# Patient Record
Sex: Male | Born: 1990 | Race: White | Hispanic: No | Marital: Single | Smoking: Never smoker
Health system: Southern US, Community
[De-identification: ages and names within clinical notes are randomized; demographics above are authoritative.]

## PROBLEM LIST (undated history)

## (undated) DIAGNOSIS — D4989 Neoplasm of unspecified behavior of other specified sites: Secondary | ICD-10-CM

## (undated) HISTORY — PX: WISDOM TOOTH EXTRACTION: SHX21

## (undated) HISTORY — PX: KNEE SURGERY: SHX244

---

## 2019-09-04 ENCOUNTER — Encounter (HOSPITAL_COMMUNITY): Payer: Self-pay | Admitting: *Deleted

## 2019-09-04 ENCOUNTER — Emergency Department (HOSPITAL_COMMUNITY): Payer: Self-pay

## 2019-09-04 ENCOUNTER — Other Ambulatory Visit: Payer: Self-pay

## 2019-09-04 ENCOUNTER — Emergency Department (HOSPITAL_COMMUNITY)
Admission: EM | Admit: 2019-09-04 | Discharge: 2019-09-04 | Disposition: A | Discharge status: Home / Self Care | Payer: Self-pay | Attending: Emergency Medicine | Admitting: Emergency Medicine

## 2019-09-04 DIAGNOSIS — K4090 Unilateral inguinal hernia, without obstruction or gangrene, not specified as recurrent: Secondary | ICD-10-CM | POA: Insufficient documentation

## 2019-09-04 LAB — URINALYSIS, ROUTINE W REFLEX MICROSCOPIC
Bilirubin Urine: NEGATIVE
Glucose, UA: NEGATIVE mg/dL
Hgb urine dipstick: NEGATIVE
Ketones, ur: NEGATIVE mg/dL
Leukocytes,Ua: NEGATIVE
Nitrite: NEGATIVE
Protein, ur: NEGATIVE mg/dL
Specific Gravity, Urine: 1.015 (ref 1.005–1.030)
pH: 6 (ref 5.0–8.0)

## 2019-09-04 LAB — CBC
HCT: 45.4 % (ref 39.0–52.0)
Hemoglobin: 15.4 g/dL (ref 13.0–17.0)
MCH: 29.5 pg (ref 26.0–34.0)
MCHC: 33.9 g/dL (ref 30.0–36.0)
MCV: 87 fL (ref 80.0–100.0)
Platelets: 281 10*3/uL (ref 150–400)
RBC: 5.22 MIL/uL (ref 4.22–5.81)
RDW: 11.9 % (ref 11.5–15.5)
WBC: 5.8 10*3/uL (ref 4.0–10.5)
nRBC: 0 % (ref 0.0–0.2)

## 2019-09-04 LAB — COMPREHENSIVE METABOLIC PANEL
ALT: 46 U/L — ABNORMAL HIGH (ref 0–44)
AST: 33 U/L (ref 15–41)
Albumin: 4.7 g/dL (ref 3.5–5.0)
Alkaline Phosphatase: 55 U/L (ref 38–126)
Anion gap: 10 (ref 5–15)
BUN: 12 mg/dL (ref 6–20)
CO2: 28 mmol/L (ref 22–32)
Calcium: 9.5 mg/dL (ref 8.9–10.3)
Chloride: 104 mmol/L (ref 98–111)
Creatinine, Ser: 1.24 mg/dL (ref 0.61–1.24)
GFR calc Af Amer: >60 mL/min (ref >60)
GFR calc non Af Amer: >60 mL/min (ref >60)
Glucose, Bld: 109 mg/dL — ABNORMAL HIGH (ref 70–99)
Potassium: 3.8 mmol/L (ref 3.5–5.1)
Sodium: 142 mmol/L (ref 135–145)
Total Bilirubin: 0.5 mg/dL (ref 0.3–1.2)
Total Protein: 7.2 g/dL (ref 6.5–8.1)

## 2019-09-04 LAB — LIPASE, BLOOD: Lipase: 37 U/L (ref 11–51)

## 2019-09-04 MED ORDER — SODIUM CHLORIDE 0.9% FLUSH: 3.0000 mL | Freq: Once | INTRAVENOUS | Status: DC

## 2019-09-04 NOTE — ED Notes (Signed)
Pt transported to Ultrasound.  

## 2019-09-04 NOTE — ED Triage Notes (Signed)
The [facility]  No blood was drawn  He was having testicle pain

## 2019-09-04 NOTE — ED Provider Notes (Addendum)
[facility] EMERGENCY DEPARTMENT Provider Note   CSN: [id number] Arrival date & time: 09/04/19  1508     History Chief Complaint  Patient presents with  . Abdominal Pain    Paul Ortega is a 29 y.o. male who presents to ED with a chief complaint of right testicle, groin pain, left-sided abdominal pain and back pain.  1 week ago started having right testicle pain that would radiate up to his groin.  Symptoms have been constant without specific aggravating or alleviating factor.  He was seen and evaluated at urgent care 2 days after symptoms began with negative UA.  He has not been taking any medications at home to help with his pain.  States that he googled his symptoms and was concerned about testicular torsion.  Has been performing self exams at home but is not able to palpate a mass or specific area of tenderness.  He is concerned that he may have a hernia as he does heavy lifting.  Reports the left-sided abdominal pain and back pain has gradually gotten worse.  Denies any nausea, vomiting, diarrhea, cough, shortness of breath, dysuria, penile discharge, concern for STDs or history of testicular torsion. He does do heavy lifting at work.  HPI     History reviewed. No pertinent past medical history.  There are no problems to display for this patient.   History reviewed. No pertinent surgical history.     No family history on file.  Social History   Tobacco Use  . Smoking status: Never Smoker  . Smokeless tobacco: Never Used  Substance Use Topics  . Alcohol use: Yes  . Drug use: Not on file    Home Medications Prior to Admission medications   Not on File    Allergies    Patient has no known allergies.  Review of Systems   Review of Systems  Constitutional: Negative for appetite change, chills and fever.  HENT: Negative for ear pain, rhinorrhea, sneezing and sore throat.   Eyes: Negative for photophobia and visual disturbance.  Respiratory:  Negative for cough, chest tightness, shortness of breath and wheezing.   Cardiovascular: Negative for chest pain and palpitations.  Gastrointestinal: Positive for abdominal pain. Negative for blood in stool, constipation, diarrhea, nausea and vomiting.  Genitourinary: Positive for testicular pain. Negative for dysuria, hematuria and urgency.  Musculoskeletal: Positive for back pain. Negative for myalgias.  Skin: Negative for rash.  Neurological: Negative for dizziness, weakness and light-headedness.    Physical Exam Updated Vital Signs BP (!) 151/87 (BP Location: Left Arm)   Pulse 75   Temp 98.3 F (36.8 C) (Oral)   Resp 17   Ht 5\' 10"  (1.778 m)   Wt 72.6 kg   SpO2 100%   BMI 22.96 kg/m   Physical Exam Vitals and nursing note reviewed. Exam conducted with a chaperone present.  Constitutional:      General: He is not in acute distress.    Appearance: He is well-developed.  HENT:     Head: Normocephalic and atraumatic.     Nose: Nose normal.  Eyes:     General: No scleral icterus.       Left eye: No discharge.     Conjunctiva/sclera: Conjunctivae normal.  Cardiovascular:     Rate and Rhythm: Normal rate and regular rhythm.     Heart sounds: Normal heart sounds. No murmur. No friction rub. No gallop.   Pulmonary:     Effort: Pulmonary effort is normal. No respiratory distress.  Breath sounds: Normal breath sounds.  Abdominal:     General: Bowel sounds are normal. There is no distension.     Palpations: Abdomen is soft.     Tenderness: There is no abdominal tenderness. There is no guarding.  Genitourinary:    Testes:        Right: Tenderness or swelling not present.        Left: Tenderness or swelling not present.     Comments: Normal male genitalia noted. Penis, scrotum, and testicles without swelling, lesions, rashes, or tenderness present. No penile discharge noted.  Cremasteric reflex intact. RN served as Producer, television/film/video during the exam.  I believe I was able to palpate a  hernia in the R inguinal area which does not appear painful when I palpate. Musculoskeletal:        General: Normal range of motion.     Cervical back: Normal range of motion and neck supple.  Skin:    General: Skin is warm and dry.     Findings: No rash.  Neurological:     Mental Status: He is alert.     Motor: No abnormal muscle tone.     Coordination: Coordination normal.     ED Results / Procedures / Treatments   Labs (all labs ordered are listed, but only abnormal results are displayed) Labs Reviewed  COMPREHENSIVE METABOLIC PANEL - Abnormal; Notable for the following components:      Result Value   Glucose, Bld 109 (*)    ALT 46 (*)    All other components within normal limits  URINE CULTURE  LIPASE, BLOOD  CBC  URINALYSIS, ROUTINE W REFLEX MICROSCOPIC    EKG None  Radiology US SCROTUM W/DOPPLER  Result Date: 09/04/2019 CLINICAL DATA:  RIGHT testicle and groin pain for 1 week. EXAM: SCROTAL ULTRASOUND DOPPLER ULTRASOUND OF THE TESTICLES TECHNIQUE: Complete ultrasound examination of the testicles, epididymis, and other scrotal structures was performed. Color and spectral Doppler ultrasound were also utilized to evaluate blood flow to the testicles. COMPARISON:  None. FINDINGS: Right testicle Measurements: 4.6 x 2.4 x 2.3 cm. No mass or microlithiasis visualized. Left testicle Measurements: 4.2 x 2.1 x 3 cm. No mass or microlithiasis visualized. Right epididymis:  Normal in size and appearance. Left epididymis:  Normal in size and appearance. Hydrocele:  None visualized. Varicocele:  Minimal LEFT-sided varicocele. Pulsed Doppler interrogation of both testes demonstrates normal low resistance arterial and venous waveforms bilaterally. IMPRESSION: 1. No acute findings. No evidence of testicular torsion or orchitis. 2. Minimal LEFT-sided varicocele. Electronically Signed   By: Franki Cabot M.D.   On: 09/04/2019 17:53    Procedures Procedures (including critical care  time)  Medications Ordered in ED Medications  sodium chloride flush (NS) 0.9 % injection 3 mL (3 mLs Intravenous Not Given 09/04/19 1605)    ED Course  I have reviewed the triage vital signs and the nursing notes.  Pertinent labs & imaging results that were available during my care of the patient were reviewed by me and considered in my medical decision making (see chart for details).    MDM Rules/Calculators/A&P                      29 year old male presenting to the ED for right-sided testicle and groin pain as well as left-sided abdominal pain for the past week.  Testicle pain and groin pain has been constant.  Negative urinalysis at urgent care when symptoms began.  There are no testicular masses or  tenderness palpation on exam.  Tenderness with patient left-sided abdomen round about rebound or guarding.  Lower back is generally tender without midline tenderness.  Lab work is unremarkable.  Will obtain ultrasound to rule out testicular torsion and reassess.  He declines pain medication at this time. He is overall well appearing.  Ultrasound is negative for torsion, orchitis or other abnormalities.  There is a left-sided varicocele.  Overall believe the cause of his symptoms could be this inguinal hernia.  He is declining further work-up for abdominal discomfort and states that he is able to manage his pain at home if needed.  He is able to tolerate p.o. intake without difficulty.  He would like follow-up with the specialist regarding his inguinal hernia so we will provide this for him.  We will have him continue over-the-counter pain medication and return for worsening symptoms.  Patient is hemodynamically stable, in NAD, and able to ambulate in the ED. Evaluation does not show pathology that would require ongoing emergent intervention or inpatient treatment. I explained the diagnosis to the patient. Pain has been managed and has no complaints prior to discharge. Patient is comfortable with  above plan and is stable for discharge at this time. All questions were answered prior to disposition. Strict return precautions for returning to the ED were discussed. Encouraged follow up with PCP.   An After Visit Summary was printed and given to the patient.   Portions of this note were generated with [employer]. Dictation errors may occur despite best attempts at proofreading.  Final Clinical Impression(s) / ED Diagnoses Final diagnoses:  Unilateral inguinal hernia without obstruction or gangrene, recurrence not specified    Rx / DC Orders ED Discharge Orders    None        Delia Heady, PA-C 09/04/19 1802    Lajean Saver, MD 09/04/19 2241

## 2019-09-04 NOTE — ED Triage Notes (Signed)
Thew pt has had abd pain for one week  No n v or diarrhea sl constipation.  He was seen at [facility] several days ago  But hes no better. No temp

## 2019-09-04 NOTE — Discharge Instructions (Signed)
Take Tylenol or ibuprofen as needed for your discomfort. Follow-up with the surgery center listed below. Return to the ED if you start to experience worsening symptoms, testicle pain or swelling, discharge, fever.

## 2019-09-06 LAB — URINE CULTURE: Culture: NO GROWTH

## 2020-07-16 ENCOUNTER — Ambulatory Visit (HOSPITAL_COMMUNITY)
Admission: EM | Admit: 2020-07-16 | Discharge: 2020-07-16 | Disposition: A | Discharge status: Home / Self Care | Payer: BLUE CROSS/BLUE SHIELD | Attending: Emergency Medicine | Admitting: Emergency Medicine

## 2020-07-16 ENCOUNTER — Other Ambulatory Visit: Payer: Self-pay

## 2020-07-16 ENCOUNTER — Ambulatory Visit (INDEPENDENT_AMBULATORY_CARE_PROVIDER_SITE_OTHER): Payer: BLUE CROSS/BLUE SHIELD

## 2020-07-16 ENCOUNTER — Encounter (HOSPITAL_COMMUNITY): Payer: Self-pay | Admitting: *Deleted

## 2020-07-16 DIAGNOSIS — K5900 Constipation, unspecified: Secondary | ICD-10-CM

## 2020-07-16 DIAGNOSIS — R194 Change in bowel habit: Secondary | ICD-10-CM | POA: Insufficient documentation

## 2020-07-16 DIAGNOSIS — R599 Enlarged lymph nodes, unspecified: Secondary | ICD-10-CM | POA: Diagnosis present

## 2020-07-16 HISTORY — DX: Neoplasm of unspecified behavior of other specified sites: D49.89

## 2020-07-16 LAB — CBC WITH DIFFERENTIAL/PLATELET
Abs Immature Granulocytes: 0.01 10*3/uL (ref 0.00–0.07)
Basophils Absolute: 0.1 10*3/uL (ref 0.0–0.1)
Basophils Relative: 2 %
Eosinophils Absolute: 0.1 10*3/uL (ref 0.0–0.5)
Eosinophils Relative: 3 %
HCT: 41.4 % (ref 39.0–52.0)
Hemoglobin: 14.5 g/dL (ref 13.0–17.0)
Immature Granulocytes: 0 %
Lymphocytes Relative: 48 %
Lymphs Abs: 2.7 10*3/uL (ref 0.7–4.0)
MCH: 30 pg (ref 26.0–34.0)
MCHC: 35 g/dL (ref 30.0–36.0)
MCV: 85.7 fL (ref 80.0–100.0)
Monocytes Absolute: 0.6 10*3/uL (ref 0.1–1.0)
Monocytes Relative: 12 %
Neutro Abs: 1.9 10*3/uL (ref 1.7–7.7)
Neutrophils Relative %: 35 %
Platelets: 234 10*3/uL (ref 150–400)
RBC: 4.83 MIL/uL (ref 4.22–5.81)
RDW: 12.3 % (ref 11.5–15.5)
WBC: 5.4 10*3/uL (ref 4.0–10.5)
nRBC: 0 % (ref 0.0–0.2)

## 2020-07-16 NOTE — Discharge Instructions (Addendum)
Your abdominal film today does not demonstrate any concerning findings or significant stool burden.  Increase the water in your diet.  Incorporate plenty of fiber in your diet such as raw fruits and vegetables.  Regular exercise is also helpful in managing your bowel habits.  A supplement such as metamucil may also be helpful.  If needed for constipation may use miralax.  I do recommend establishing with a primary care provider as you may need further evaluation or GI referral if any persistent or worsening of symptoms.  I would call you if you have any abnormal testing today, you may see your results on your MyChart as well.

## 2020-07-16 NOTE — ED Provider Notes (Signed)
[facility]    CSN: [id number] Arrival date & time: 07/16/20  1130      History   Chief Complaint Chief Complaint  Patient presents with  . Abdominal Pain  . Constipation    HPI Paul Ortega is a 29 y.o. male.   Paul Ortega presents with complaints of 1 month of feeling constipated. States he previously would have a bm approximately daily, but for the past month feels like he can only pass stool approximately every 2- 3 days and it feels difficult. He took OTC dulcolax which caused large BM and even diarrhea, last week. No blood or mucus to stool. No nausea or vomiting. No specific loss of appetite. Denies any changes to diet, exercise or stress level. Denies any new medications or recent surgeries. Also with a "bump" under chin which he noted approximately 1 month ago. Hasn't increased in size and isn't tender but hasn't resolved so he was worried about this. Denies any dental pain or other URI issues    ROS per HPI, negative if not otherwise mentioned.      Past Medical History:  Diagnosis Date  . Knee tumor    benign, as teenager    There are no problems to display for this patient.   Past Surgical History:  Procedure Laterality Date  . KNEE SURGERY     to remove benign tumor  . WISDOM TOOTH EXTRACTION         Home Medications    Prior to Admission medications   Not on File    Family History Family History  Problem Relation Age of Onset  . Healthy Mother   . Cancer Father     Social History Social History   Tobacco Use  . Smoking status: Never Smoker  . Smokeless tobacco: Never Used  Vaping Use  . Vaping Use: Never used  Substance Use Topics  . Alcohol use: Yes    Comment: occasionally  . Drug use: Not Currently    Types: Marijuana     Allergies   Patient has no known allergies.   Review of Systems Review of Systems   Physical Exam Triage Vital Signs ED Triage Vitals  Enc Vitals Group     BP 07/16/20  1246 128/66     Pulse Rate 07/16/20 1246 (!) 57     Resp 07/16/20 1246 12     Temp 07/16/20 1246 97.7 F (36.5 C)     Temp Source 07/16/20 1246 Oral     SpO2 07/16/20 1246 100 %     Weight --      Height --      Head Circumference --      Peak Flow --      Pain Score 07/16/20 1247 3     Pain Loc --      Pain Edu? --      Excl. in [place]? --    No data found.  Updated Vital Signs BP 128/66   Pulse (!) 57   Temp 97.7 F (36.5 C) (Oral)   Resp 12   SpO2 100%   Visual Acuity Right Eye Distance:   Left Eye Distance:   Bilateral Distance:    Right Eye Near:   Left Eye Near:    Bilateral Near:     Physical Exam Constitutional:      Appearance: He is well-developed.  HENT:     Nose:      Comments: Approximately 72mm mobile palpable lymph node, at midline;  non tender Cardiovascular:     Rate and Rhythm: Normal rate.  Pulmonary:     Effort: Pulmonary effort is normal.  Abdominal:     Tenderness: There is no abdominal tenderness.  Skin:    General: Skin is warm and dry.  Neurological:     Mental Status: He is alert and oriented to person, place, and time.      UC Treatments / Results  Labs (all labs ordered are listed, but only abnormal results are displayed) Labs Reviewed  CBC WITH DIFFERENTIAL/PLATELET    EKG   Radiology DG Abd 2 Views  Result Date: 07/16/2020 CLINICAL DATA:  Constipation for 1 month EXAM: ABDOMEN - 2 VIEW COMPARISON:  None. FINDINGS: Scattered large and small bowel gas is noted. No free air is noted. No significant retained fecal material is seen to correspond with the given clinical history. No soft tissue mass is seen. No bony abnormality is noted. IMPRESSION: No acute abnormality noted. Electronically Signed   By: Inez Catalina M.D.   On: 07/16/2020 13:28    Procedures Procedures (including critical care time)  Medications Ordered in UC Medications - No data to display  Initial Impression / Assessment and Plan / UC Course  I have  reviewed the triage vital signs and the nursing notes.  Pertinent labs & imaging results that were available during my care of the patient were reviewed by me and considered in my medical decision making (see chart for details).     Abdominal film without acute findings and without significant stool burden. Still passing stool but decreased frequency. Bowel habits discussed, encouraged use of supplement such as metamucil, increased water and activity. Cbc obtained due to patient concern with submental lymph node. Follow up recommendations provided. Patient verbalized understanding and agreeable to plan.   Final Clinical Impressions(s) / UC Diagnoses   Final diagnoses:  Change in bowel habits  Palpable lymph node     Discharge Instructions     Your abdominal film today does not demonstrate any concerning findings or significant stool burden.  Increase the water in your diet.  Incorporate plenty of fiber in your diet such as raw fruits and vegetables.  Regular exercise is also helpful in managing your bowel habits.  A supplement such as metamucil may also be helpful.  If needed for constipation may use miralax.  I do recommend establishing with a primary care provider as you may need further evaluation or GI referral if any persistent or worsening of symptoms.  I would call you if you have any abnormal testing today, you may see your results on your MyChart as well.     ED Prescriptions    None     PDMP not reviewed this encounter.   Zigmund Gottron, NP 07/16/20 1510

## 2020-07-16 NOTE — ED Triage Notes (Signed)
C/O constipation x 1 month without any significant BMs.  C/O constant bloated feeling with intermittent abd pains.  Denies n/v.  Has used an OTC laxative, which has become painful due to causing significant abd cramping, and doesn't provide relief of constipation, so pt discontinued use.  Also concerned about "lump" under chin.

## 2020-08-16 ENCOUNTER — Ambulatory Visit (HOSPITAL_COMMUNITY)
Admission: EM | Admit: 2020-08-16 | Discharge: 2020-08-16 | Disposition: A | Discharge status: Home / Self Care | Payer: BLUE CROSS/BLUE SHIELD | Attending: Emergency Medicine | Admitting: Emergency Medicine

## 2020-08-16 ENCOUNTER — Encounter (HOSPITAL_COMMUNITY): Payer: Self-pay | Admitting: Emergency Medicine

## 2020-08-16 DIAGNOSIS — U071 COVID-19: Secondary | ICD-10-CM | POA: Diagnosis not present

## 2020-08-16 DIAGNOSIS — R112 Nausea with vomiting, unspecified: Secondary | ICD-10-CM | POA: Insufficient documentation

## 2020-08-16 DIAGNOSIS — J029 Acute pharyngitis, unspecified: Secondary | ICD-10-CM | POA: Insufficient documentation

## 2020-08-16 DIAGNOSIS — R197 Diarrhea, unspecified: Secondary | ICD-10-CM | POA: Insufficient documentation

## 2020-08-16 DIAGNOSIS — J111 Influenza due to unidentified influenza virus with other respiratory manifestations: Secondary | ICD-10-CM

## 2020-08-16 LAB — RESP PANEL BY RT-PCR (FLU A&B, COVID) ARPGX2
Influenza A by PCR: NEGATIVE
Influenza B by PCR: NEGATIVE
SARS Coronavirus 2 by RT PCR: POSITIVE — AB

## 2020-08-16 MED ORDER — ONDANSETRON 4 MG PO TBDP: 4.0000 mg | ORAL_TABLET | Freq: Three times a day (TID) | ORAL | 0 refills | Status: AC | PRN

## 2020-08-16 NOTE — ED Triage Notes (Signed)
Head congestion, ears hurt, sore throat, diarrhea for 2 days.  Onset 08/14/2020.  Patient having general body aches

## 2020-08-16 NOTE — ED Provider Notes (Signed)
[facility]    CSN: [id number] Arrival date & time: 08/16/20  6333      History   Chief Complaint Chief Complaint  Patient presents with  . Sore Throat    HPI Paul Ortega is a 30 y.o. male.   Paul Ortega presents with complaints of symptoms which started two days ago to include- fevers, chills, sweating, body aches, nausea and vomiting, congestion, sore throat, cough, headache, and diarrhea. No shortness of breath. No skin rash.  A friend tested positive yesterday, he was around them a few days prior to symptoms onset. Dayquil and nyquil have not helped with symptoms. Last took at 0200 this morning. No current nausea. Vaccinated for covid-19, but not boosted.   ROS per HPI, negative if not otherwise mentioned.       Past Medical History:  Diagnosis Date  . Knee tumor    benign, as teenager    There are no problems to display for this patient.   Past Surgical History:  Procedure Laterality Date  . KNEE SURGERY     to remove benign tumor  . WISDOM TOOTH EXTRACTION         Home Medications    Prior to Admission medications   Medication Sig Start Date End Date Taking? Authorizing Provider  ondansetron (ZOFRAN-ODT) 4 MG disintegrating tablet Take 1 tablet (4 mg total) by mouth every 8 (eight) hours as needed for nausea or vomiting. 08/16/20  Yes Torrin Frein, Dorene Grebe B, NP  Pseudoeph-Doxylamine-DM-APAP (NYQUIL PO) Take by mouth.   Yes [provider]    Family History Family History  Problem Relation Age of Onset  . Healthy Mother   . Cancer Father     Social History Social History   Tobacco Use  . Smoking status: Never Smoker  . Smokeless tobacco: Never Used  Vaping Use  . Vaping Use: Never used  Substance Use Topics  . Alcohol use: Yes    Comment: occasionally  . Drug use: Not Currently    Types: Marijuana     Allergies   Patient has no known allergies.   Review of Systems Review of Systems   Physical  Exam Triage Vital Signs ED Triage Vitals  Enc Vitals Group     BP 08/16/20 0837 108/72     Pulse Rate 08/16/20 0837 89     Resp 08/16/20 0837 20     Temp 08/16/20 0837 99.2 F (37.3 C)     Temp Source 08/16/20 0837 Oral     SpO2 08/16/20 0837 98 %     Weight --      Height --      Head Circumference --      Peak Flow --      Pain Score 08/16/20 0834 7     Pain Loc --      Pain Edu? --      Excl. in [place]? --    No data found.  Updated Vital Signs BP 108/72 (BP Location: Left Arm)   Pulse 89   Temp 99.2 F (37.3 C) (Oral)   Resp 20   SpO2 98%   Visual Acuity Right Eye Distance:   Left Eye Distance:   Bilateral Distance:    Right Eye Near:   Left Eye Near:    Bilateral Near:     Physical Exam Constitutional:      Appearance: He is well-developed. He is ill-appearing.  Cardiovascular:     Rate and Rhythm: Normal rate.  Pulmonary:  Effort: Pulmonary effort is normal.  Skin:    General: Skin is warm and dry.  Neurological:     Mental Status: He is alert and oriented to person, place, and time.      UC Treatments / Results  Labs (all labs ordered are listed, but only abnormal results are displayed) Labs Reviewed  RESP PANEL BY RT-PCR (FLU A&B, COVID) [id number]    EKG   Radiology No results found.  Procedures Procedures (including critical care time)  Medications Ordered in UC Medications - No data to display  Initial Impression / Assessment and Plan / UC Course  I have reviewed the triage vital signs and the nursing notes.  Pertinent labs & imaging results that were available during my care of the patient were reviewed by me and considered in my medical decision making (see chart for details).     covid vs influenza. Known covid exposure which makes this more likely at this time. Non toxic. Supportive cares recommended. Covid testing pending and isolation instructions provided.  Return precautions provided. Patient verbalized understanding and  agreeable to plan.   Final Clinical Impressions(s) / UC Diagnoses   Final diagnoses:  Influenza-like illness     Discharge Instructions     Push fluids to ensure adequate hydration and keep secretions thin.  Tylenol and/or ibuprofen as needed for pain or fevers.  If you are taking dayquil/nyquil you can supplement with ibuprofen as these typically carry tylenol (acetaminophen)  Throat lozenges, gargles, chloraseptic spray, warm teas, popsicles etc to help with throat pain.   Self isolate until covid results are back. If positive you should isolate for at least 5 days assuming symptoms are resolving after 5 days, and wear a mask for additional 5 days.   Will notify you by phone of any positive findings. Your negative results will be sent through your MyChart.     Zofran every 8 hours as needed for nausea or vomiting.   Small frequent sips of fluids- Pedialyte, Gatorade, water, broth- to maintain hydration.   If symptoms worsen or do not improve in the next week to return to be seen or to follow up with your PCP.      ED Prescriptions    Medication Sig Dispense Auth. Provider   ondansetron (ZOFRAN-ODT) 4 MG disintegrating tablet Take 1 tablet (4 mg total) by mouth every 8 (eight) hours as needed for nausea or vomiting. 12 tablet Zigmund Gottron, NP     PDMP not reviewed this encounter.   Zigmund Gottron, NP 08/16/20 [phone]

## 2020-08-16 NOTE — Discharge Instructions (Signed)
Push fluids to ensure adequate hydration and keep secretions thin.  Tylenol and/or ibuprofen as needed for pain or fevers.  If you are taking dayquil/nyquil you can supplement with ibuprofen as these typically carry tylenol (acetaminophen)  Throat lozenges, gargles, chloraseptic spray, warm teas, popsicles etc to help with throat pain.   Self isolate until covid results are back. If positive you should isolate for at least 5 days assuming symptoms are resolving after 5 days, and wear a mask for additional 5 days.   Will notify you by phone of any positive findings. Your negative results will be sent through your MyChart.     Zofran every 8 hours as needed for nausea or vomiting.   Small frequent sips of fluids- Pedialyte, Gatorade, water, broth- to maintain hydration.   If symptoms worsen or do not improve in the next week to return to be seen or to follow up with your PCP.

## 2021-12-23 IMAGING — US US SCROTUM W/ DOPPLER COMPLETE
1 series · 14 of 25 positions shown · non-contrast
Comparison: None.

CLINICAL DATA: RIGHT testicle and groin pain for 1 week.

EXAM:
SCROTAL ULTRASOUND
DOPPLER ULTRASOUND OF THE TESTICLES
TECHNIQUE: Complete ultrasound examination of the testicles, epididymis, and
other scrotal structures was performed. Color and spectral Doppler
ultrasound were also utilized to evaluate blood flow to the
testicles.

[Series 1: us scrotum w/ doppler complete · 14 of 65 slices shown]
[im 1/65]
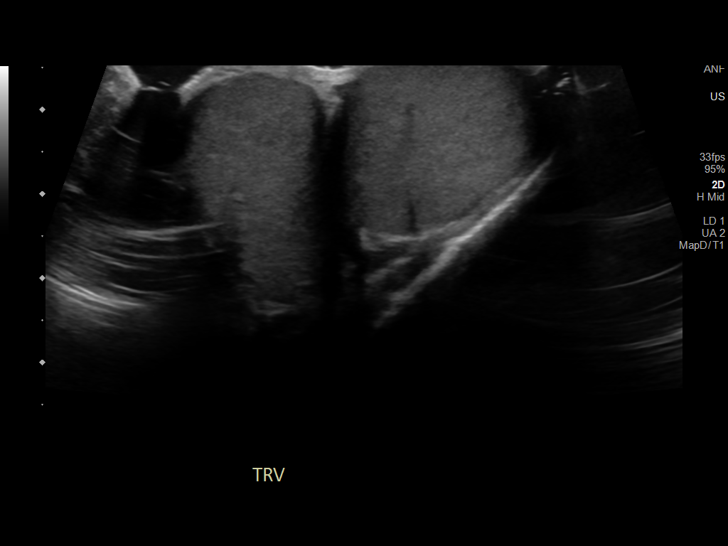
[im 6/65]
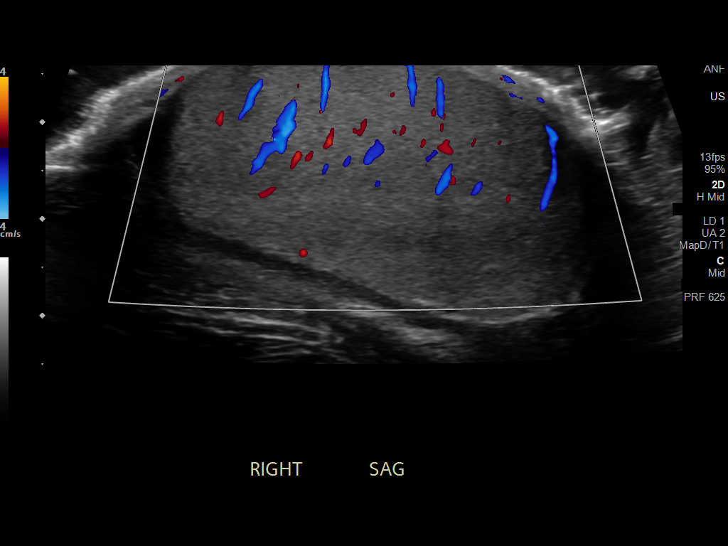
[im 11/65]
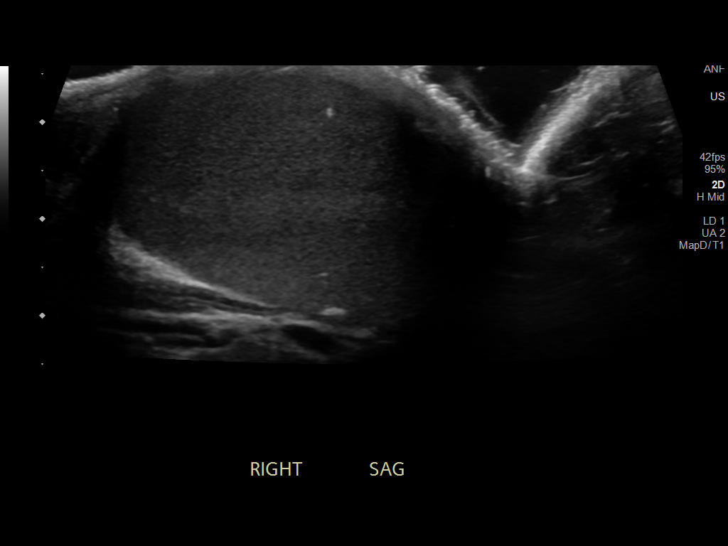
[im 17/65]
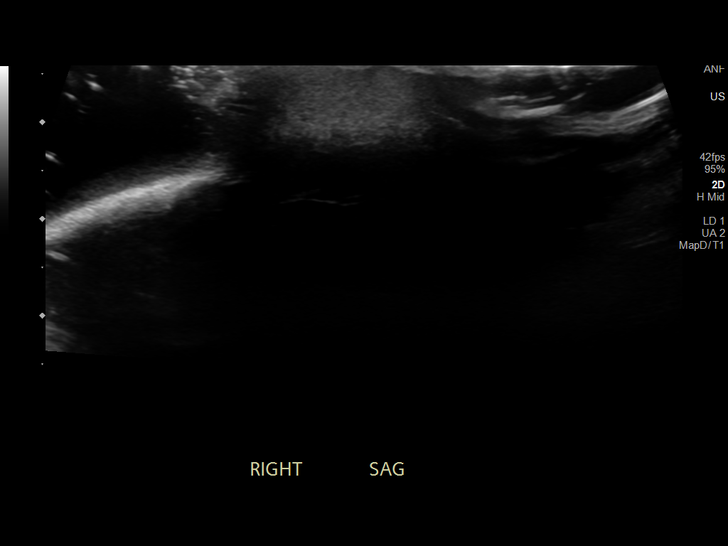
[im 22/65]
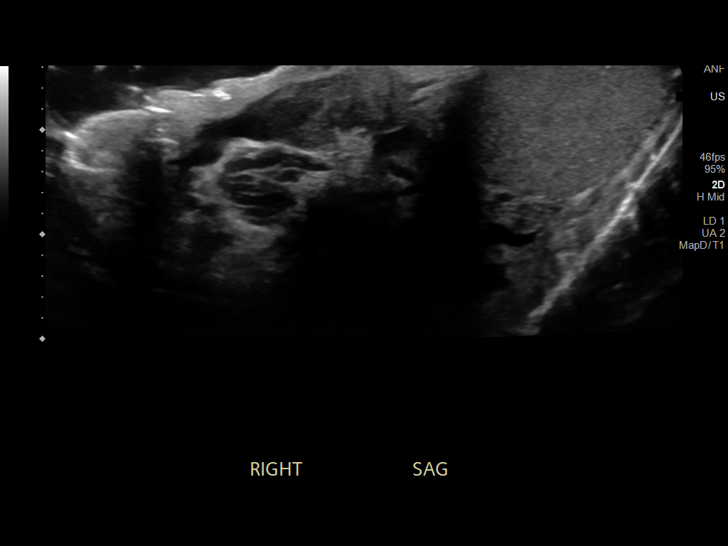
[im 25/65]
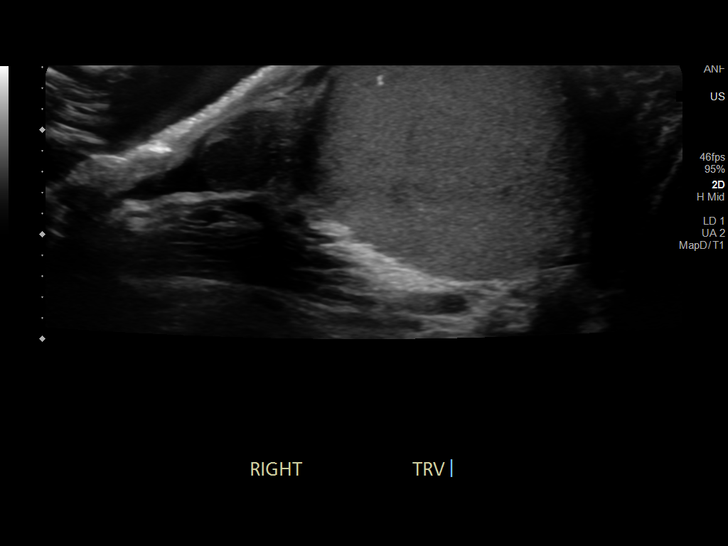
[im 30/65]
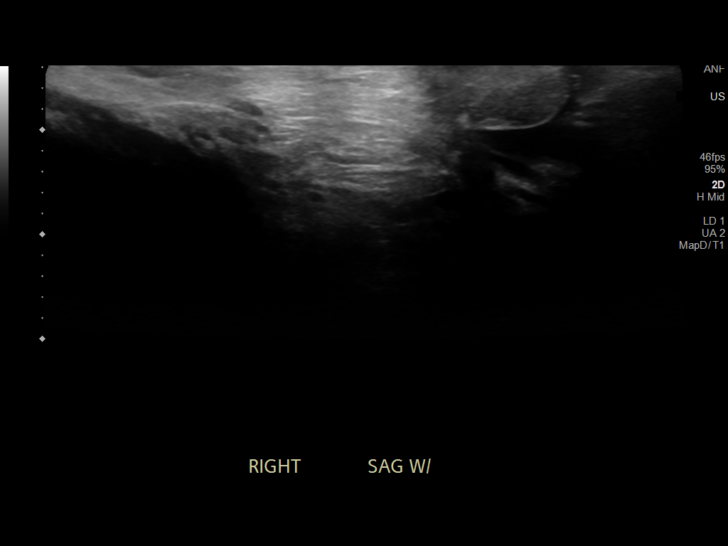
[im 35/65]
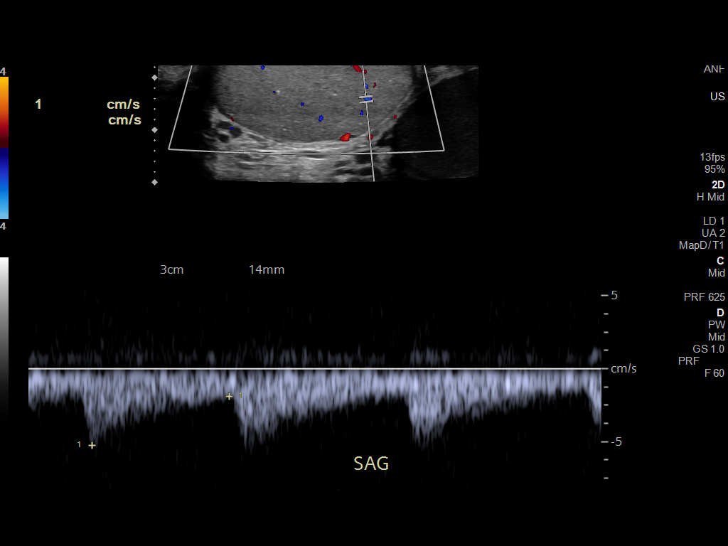
[im 41/65]
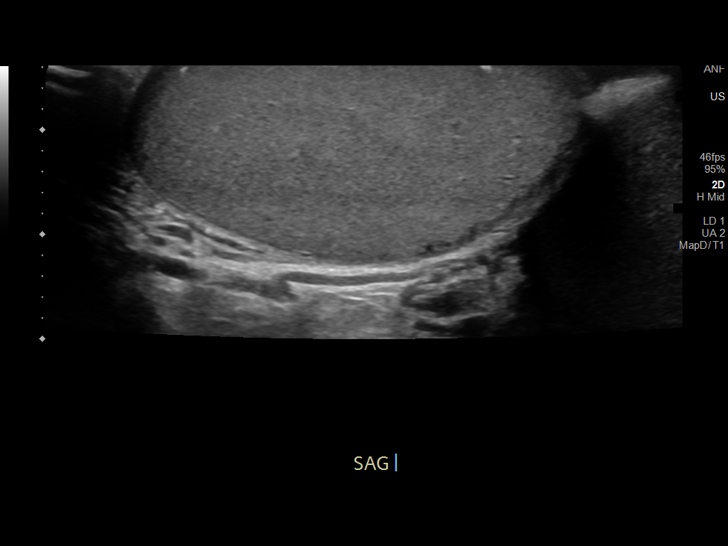
[im 43/65]
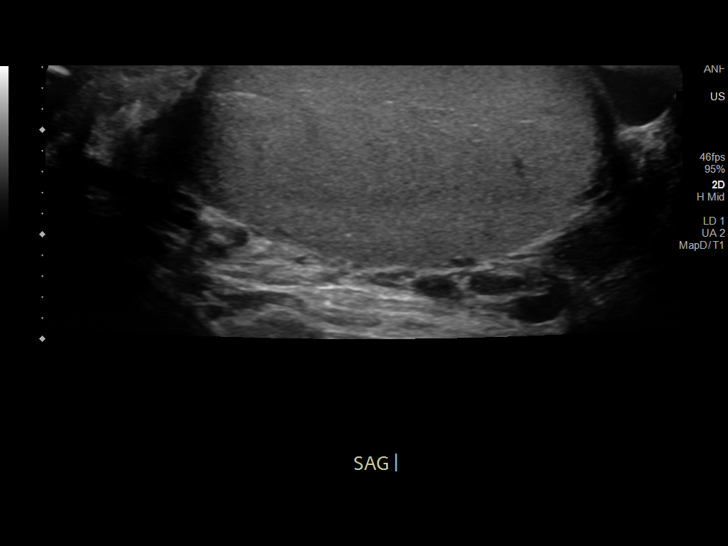
[im 49/65]
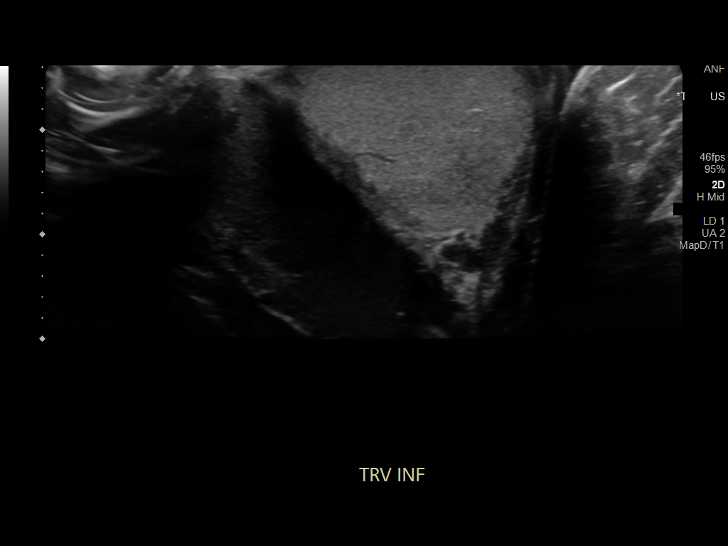
[im 54/65]
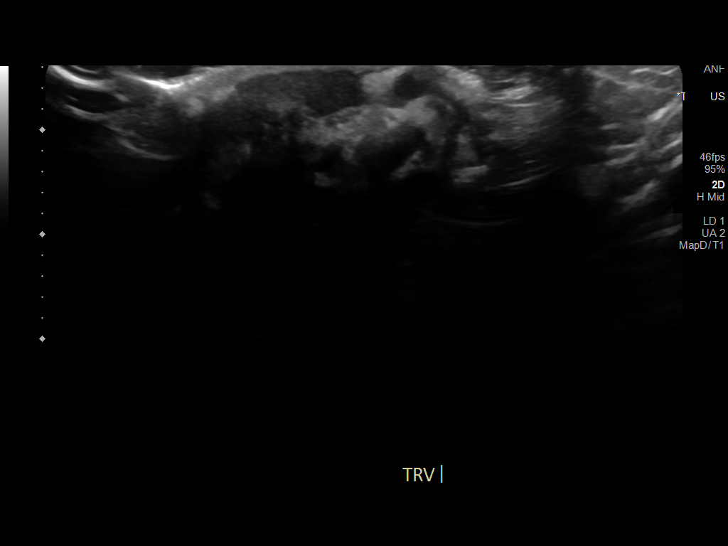
[im 59/65]
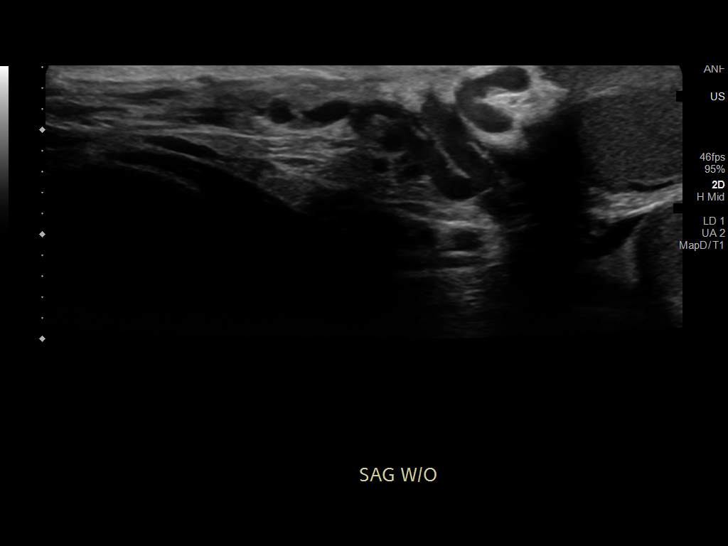
[im 65/65]
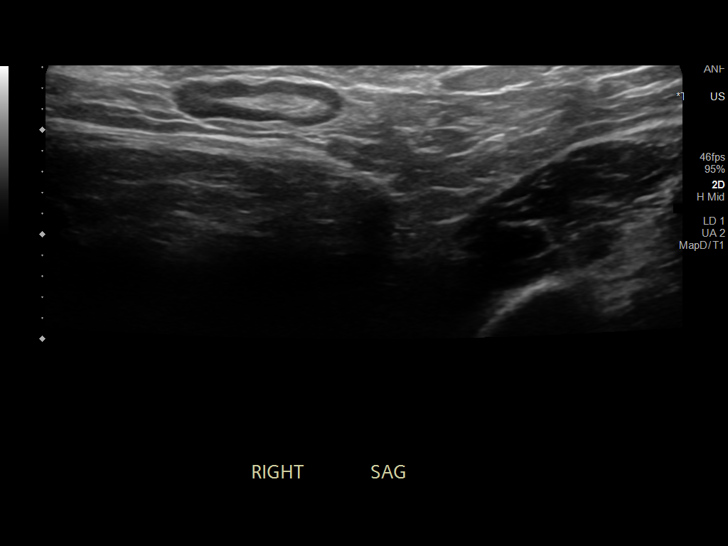

[14 of 25 positions shown; findings below may reference images not displayed]

FINDINGS: Right testicle

Measurements: 4.6 x 2.4 x 2.3 cm. No mass or microlithiasis
visualized.

Left testicle

Measurements: 4.2 x 2.1 x 3 cm. No mass or microlithiasis
visualized.

Right epididymis:  Normal in size and appearance.

Left epididymis:  Normal in size and appearance.

Hydrocele:  None visualized.

Varicocele:  Minimal LEFT-sided varicocele.

Pulsed Doppler interrogation of both testes demonstrates normal low
resistance arterial and venous waveforms bilaterally.
IMPRESSION: 1. No acute findings. No evidence of testicular torsion or orchitis.
2. Minimal LEFT-sided varicocele.

## 2022-11-04 IMAGING — DX DG ABDOMEN 2V
2 series · 2 of 2 positions shown · non-contrast
Comparison: None.

CLINICAL DATA: Constipation for 1 month

EXAM:
ABDOMEN - 2 VIEW

[abdomen erect]
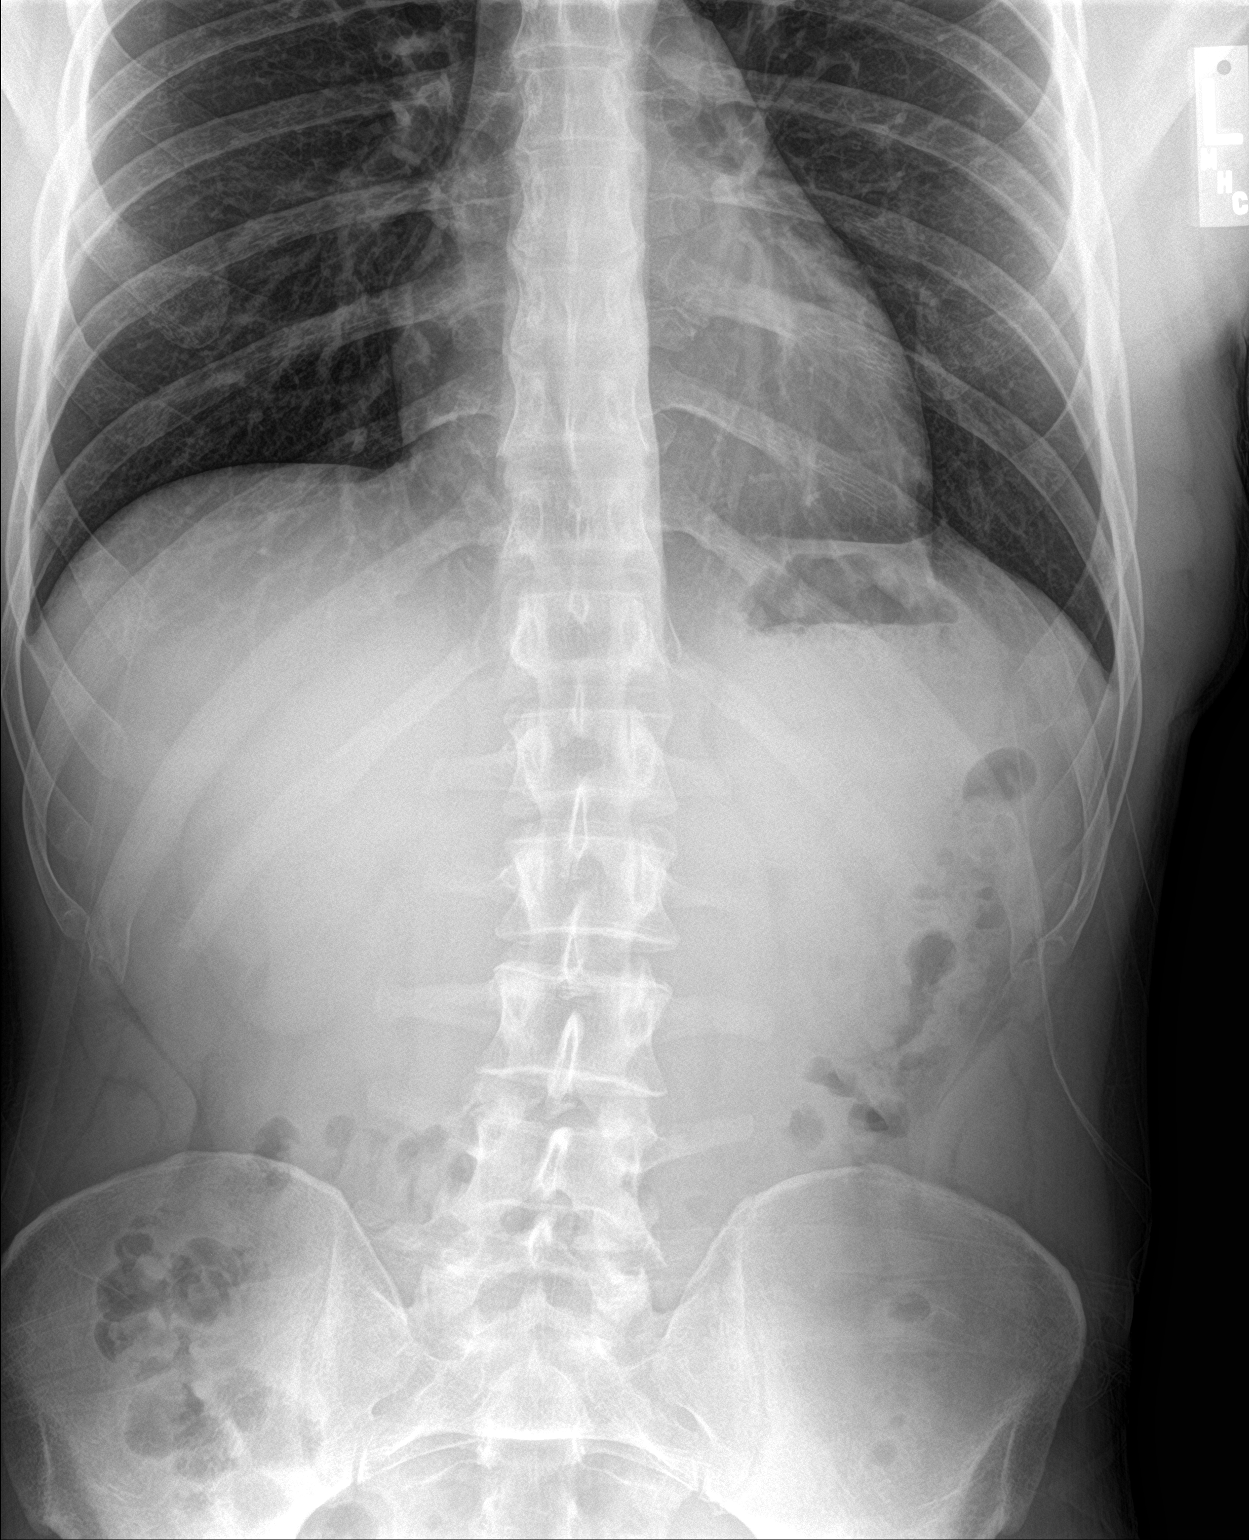

[abdomen supine]
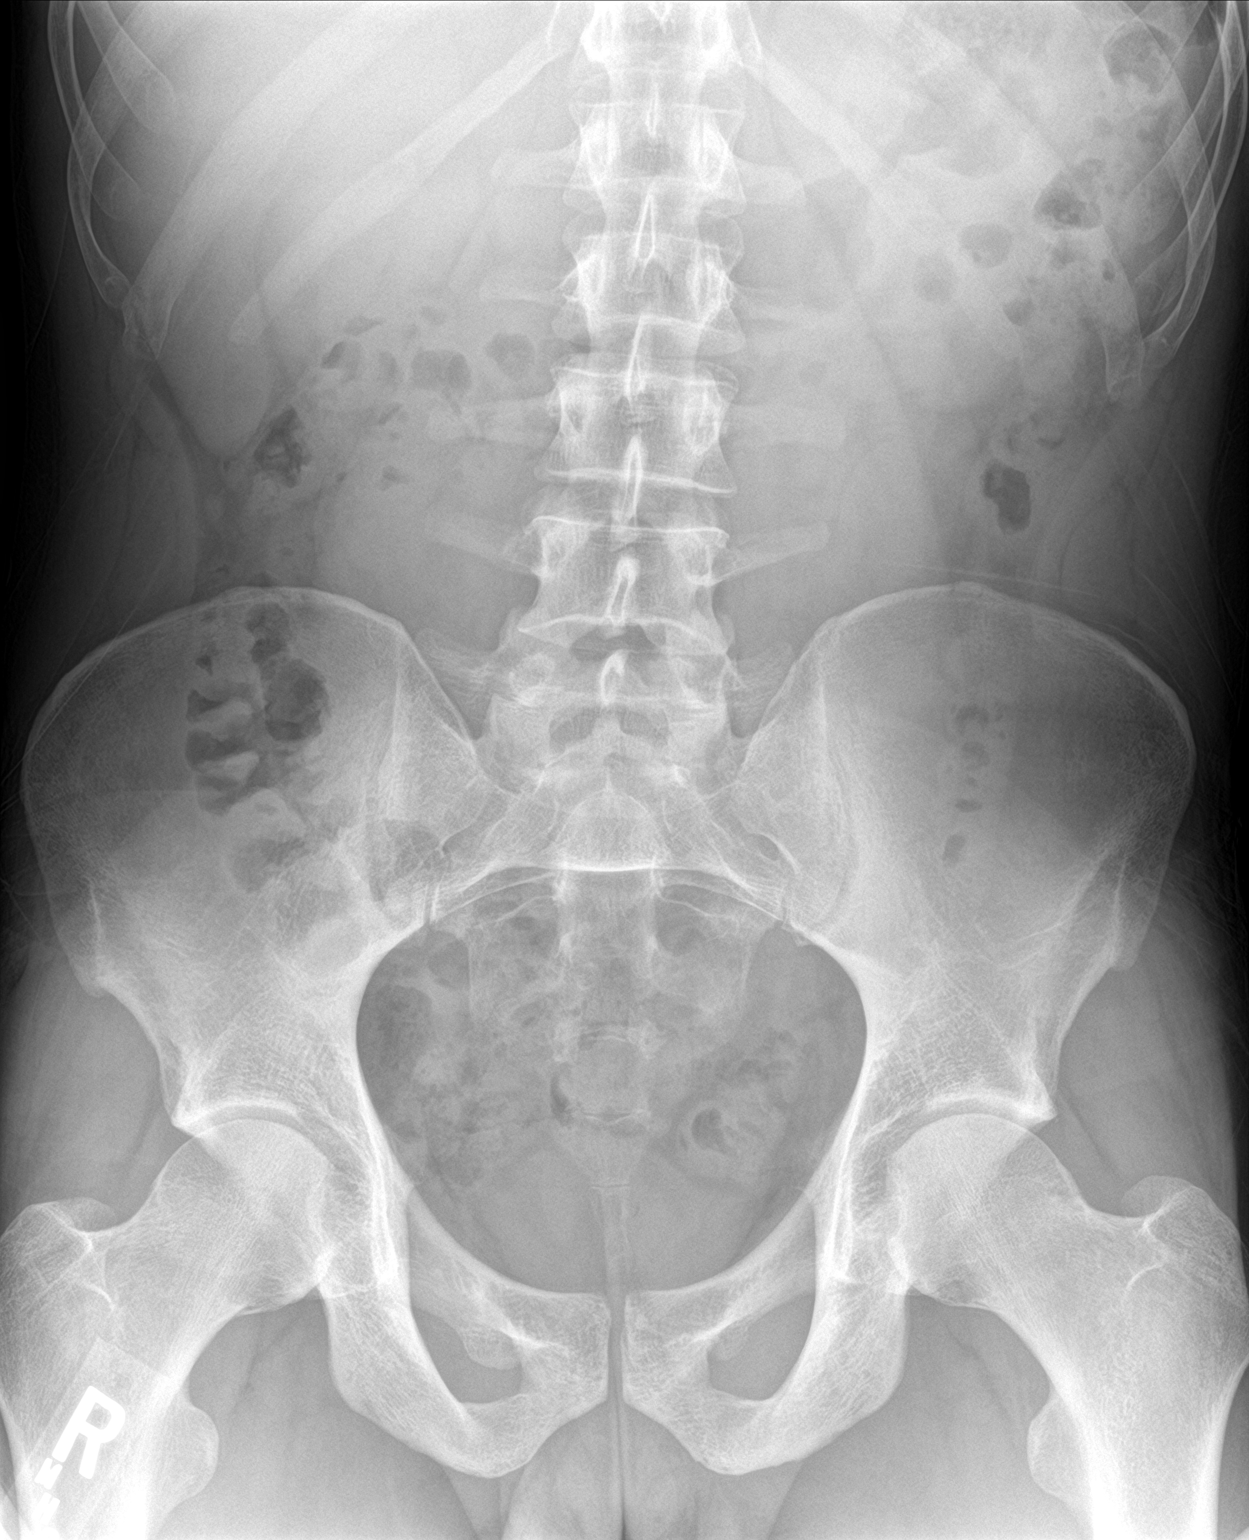

[2 of 2 positions shown; findings below may reference images not displayed]

FINDINGS: Scattered large and small bowel gas is noted. No free air is noted.
No significant retained fecal material is seen to correspond with
the given clinical history. No soft tissue mass is seen. No bony
abnormality is noted.
IMPRESSION: No acute abnormality noted.
